# Patient Record
Sex: Male | Born: 1985 | Race: Black or African American | Hispanic: No | Marital: Single | Smoking: Current some day smoker
Health system: Southern US, Community
[De-identification: ages and names within clinical notes are randomized; demographics above are authoritative.]

---

## 2017-08-01 ENCOUNTER — Other Ambulatory Visit: Payer: Self-pay

## 2017-08-01 ENCOUNTER — Emergency Department (HOSPITAL_COMMUNITY)
Admission: EM | Admit: 2017-08-01 | Discharge: 2017-08-01 | Disposition: A | Discharge status: Home / Self Care | Payer: Self-pay | Attending: Emergency Medicine | Admitting: Emergency Medicine

## 2017-08-01 ENCOUNTER — Encounter (HOSPITAL_COMMUNITY): Payer: Self-pay

## 2017-08-01 ENCOUNTER — Emergency Department (HOSPITAL_COMMUNITY): Payer: Self-pay

## 2017-08-01 DIAGNOSIS — M25551 Pain in right hip: Secondary | ICD-10-CM | POA: Insufficient documentation

## 2017-08-01 DIAGNOSIS — F17290 Nicotine dependence, other tobacco product, uncomplicated: Secondary | ICD-10-CM | POA: Insufficient documentation

## 2017-08-01 MED ORDER — IBUPROFEN 600 MG PO TABS: 600.0000 mg | ORAL_TABLET | Freq: Four times a day (QID) | ORAL | 0 refills | Status: DC | PRN

## 2017-08-01 MED ORDER — METHOCARBAMOL 500 MG PO TABS: 500.0000 mg | ORAL_TABLET | Freq: Two times a day (BID) | ORAL | 0 refills | Status: DC

## 2017-08-01 MED ORDER — IBUPROFEN 400 MG PO TABS
600.0000 mg | ORAL_TABLET | Freq: Once | ORAL | Status: AC
  Administered 2017-08-01: 600 mg via ORAL
  Filled 2017-08-01: qty 1

## 2017-08-01 NOTE — ED Triage Notes (Signed)
Pt endorses  Right hip pain that began yesterday. Denies injury, cms intact. VSS. Ambulatory.

## 2017-08-01 NOTE — ED Provider Notes (Signed)
[facility] EMERGENCY DEPARTMENT Provider Note   CSN: [id number] Arrival date & time: 08/01/17  1440     History   Chief Complaint Chief Complaint  Patient presents with  . Hip Pain    HPI Bobby David is a 32 y.o. male with no significant past medical history presents emergency department today for right hip pain.  Patient states that he works a strenuous job and noticed that when he was bending down to lift something up he had pain in his right hip that did not radiate.  He reports the pain is a dull, achy pain.  Is worse with palpation as well as when he moves his right hip in certain directions.  He has tried icy hot for his symptoms without relief.  Patient denies any fever, IV drug use, abdominal pain, nausea/vomiting/diarrhea, groin pain, testicular pain, urinary symptoms, numbness/tingling/weakness, back pain, bowel/bladder incontinence, urinary retention, joint swelling or changes of the skin.  HPI  History reviewed. No pertinent past medical history.  There are no active problems to display for this patient.   History reviewed. No pertinent surgical history.      Home Medications    Prior to Admission medications   Not on File    Family History History reviewed. No pertinent family history.  Social History Social History   Tobacco Use  . Smoking status: Current Some Day Smoker    Types: Cigars  Substance Use Topics  . Alcohol use: Yes    Comment: occ  . Drug use: Never     Allergies   Patient has no known allergies.   Review of Systems Review of Systems  All other systems reviewed and are negative.    Physical Exam Updated Vital Signs BP 138/79 (BP Location: Right Arm)   Pulse 98   Temp 99 F (37.2 C) (Oral)   Resp 16   Ht 6' (1.829 m)   Wt 108.9 kg (240 lb)   SpO2 98%   BMI 32.55 kg/m   Physical Exam  Constitutional: He appears well-developed and well-nourished.  HENT:  Head: Normocephalic and atraumatic.   Right Ear: External ear normal.  Left Ear: External ear normal.  Eyes: Conjunctivae are normal. Right eye exhibits no discharge. Left eye exhibits no discharge. No scleral icterus.  Cardiovascular:  Pulses:      Dorsalis pedis pulses are 2+ on the right side, and 2+ on the left side.       Posterior tibial pulses are 2+ on the right side, and 2+ on the left side.  No lower extremity edema.   Pulmonary/Chest: Effort normal. No respiratory distress.  Abdominal: Soft. Normal appearance and bowel sounds are normal. He exhibits no distension. There is no tenderness. There is no rigidity, no rebound, no guarding and no CVA tenderness. No hernia.  Musculoskeletal:       Right knee: Normal.       Legs: No lumbar spinous tenderness palpation or step-offs. Patient with tenderness palpation over right anterior and lateral hip.  Patient with able range of motion for hip flexion, extension, internal and external rotation.  He does note pain at extremes of these range of motion.  He has able gait but notes it is painful.  He is neurovascular intact distally.  Compartments are soft.  Neurological: He is alert.  Skin: Skin is warm, dry and intact. Capillary refill takes less than 2 seconds. No erythema. No pallor.  No joint swelling or overlying erythema or heat  Psychiatric:  He has a normal mood and affect.  Nursing note and vitals reviewed.    ED Treatments / Results  Labs (all labs ordered are listed, but only abnormal results are displayed) Labs Reviewed - No data to display  EKG None  Radiology Dg Hip Unilat W Or Wo Pelvis 2-3 Views Right  Result Date: 08/01/2017 CLINICAL DATA:  Acute right hip pain without known injury. EXAM: DG HIP (WITH OR WITHOUT PELVIS) 2-3V RIGHT COMPARISON:  None. FINDINGS: There is no evidence of hip fracture or dislocation. There is no evidence of arthropathy or other focal bone abnormality. IMPRESSION: Normal right hip. Electronically Signed   By: Lupita RaiderJames  Green Jr,  M.D.   On: 08/01/2017 15:37    Procedures Procedures (including critical care time)  Medications Ordered in ED Medications - No data to display   Initial Impression / Assessment and Plan / ED Course  I have reviewed the triage vital signs and the nursing notes.  Pertinent labs & imaging results that were available during my care of the patient were reviewed by me and considered in my medical decision making (see chart for details).     32 y.o. male presenting for right hip pain that is worse with range of motion and palpation.  He denies any trauma.  X-rays without evidence of fracture dislocation.  He denies any abdominal pain.  Abdominal exam without any tenderness palpation.  No peritoneal signs.  No hernia palpated.  Patient is without any fever. Vitals reassuring.  He denies any IV drug use.  He is without joint swelling, overlying erythema and he has intact range of motion.  Do not suspect septic joint.  Exam is reassuring as above. He denies testicular pain or urinary symptoms. No low back pain. No concern for cauda equina.  He is neurovascular intact and compartments are soft.  Will treat as possible muscle strain.  Will give crutches in the department.  Will refer to orthopedics.  Advised conservative therapy. I advised the patient to follow-up with PCP/Orthopedics this week. Specific return precautions discussed. Time was given for all questions to be answered. The patient verbalized understanding and agreement with plan. The patient appears safe for discharge home.  Final Clinical Impressions(s) / ED Diagnoses   Final diagnoses:  Right hip pain    ED Discharge Orders        Ordered    ibuprofen (ADVIL,MOTRIN) 600 MG tablet  Every 6 hours PRN     08/01/17 1644    methocarbamol (ROBAXIN) 500 MG tablet  2 times daily     08/01/17 1644       Princella PellegriniMaczis, Tawona Filsinger M, PA-C 08/01/17 1651    Rolland PorterJames, Mark, MD 08/08/17 [phone]

## 2017-08-01 NOTE — Progress Notes (Signed)
Orthopedic Tech Progress Note Patient Details:  Bobby David February 26, 1985 [id number]  Ortho Devices Type of Ortho Device: Crutches Ortho Device/Splint Interventions: Application   Post Interventions Patient Tolerated: Well, Ambulated well Instructions Provided: Care of device, Poper ambulation with device   Bobby David 08/01/2017, 4:52 PM

## 2017-08-01 NOTE — Discharge Instructions (Addendum)
Please read and follow all provided instructions.  You have been seen today for right hip pain  Tests performed today include: An x-ray of the affected area - does NOT show any broken bones or dislocations.  Vital signs. See below for your results today.   Home care instructions: -- *PRICE in the first 24-48 hours after injury: Protect (with brace, splint, sling), if given by your provider Rest Ice- Do not apply ice pack directly to your skin, place towel or similar between your skin and ice/ice pack. Apply ice for 20 min, then remove for 40 min while awake Compression- Wear brace, elastic bandage, splint as directed by your provider Elevate affected extremity above the level of your heart when not walking around for the first 24-48 hours   Use Ibuprofen (Motrin/Advil) 600mg  every 6 hours as needed for pain (do not exceed max dose in 24 hours, 2400mg )  Follow-up instructions: Please follow-up with your primary care provider or the provided orthopedic physician (bone specialist) if you continue to have significant pain in 1 week. In this case you may have a more severe injury that requires further care.   Return instructions:  Please return if your toes or feet are numb or tingling, appear gray or blue, or you have severe pain (also elevate the leg and loosen splint or wrap if you were given one) Please return to the Emergency Department if you experience worsening symptoms.  Please return if you have any other emergent concerns. Additional Information:  Your vital signs today were: BP 138/79 (BP Location: Right Arm)    Pulse 98    Temp 99 F (37.2 C) (Oral)    Resp 16    Ht 6' (1.829 m)    Wt 108.9 kg (240 lb)    SpO2 98%    BMI 32.55 kg/m  If your blood pressure (BP) was elevated above 135/85 this visit, please have this repeated by your doctor within one month. ---------------

## 2017-08-01 NOTE — ED Notes (Signed)
Patient verbalized understanding of discharge instructions and denies any further needs or questions at this time. VS stable. Patient ambulatory with steady gait.  

## 2019-02-14 ENCOUNTER — Emergency Department (HOSPITAL_COMMUNITY)
Admission: EM | Admit: 2019-02-14 | Discharge: 2019-02-14 | Disposition: A | Discharge status: Home / Self Care | Payer: Managed Care, Other (non HMO) | Attending: Emergency Medicine | Admitting: Emergency Medicine

## 2019-02-14 ENCOUNTER — Encounter (HOSPITAL_COMMUNITY): Payer: Self-pay | Admitting: Emergency Medicine

## 2019-02-14 ENCOUNTER — Other Ambulatory Visit: Payer: Self-pay

## 2019-02-14 ENCOUNTER — Emergency Department (HOSPITAL_COMMUNITY): Payer: Managed Care, Other (non HMO)

## 2019-02-14 DIAGNOSIS — R945 Abnormal results of liver function studies: Secondary | ICD-10-CM | POA: Insufficient documentation

## 2019-02-14 DIAGNOSIS — R059 Cough, unspecified: Secondary | ICD-10-CM

## 2019-02-14 DIAGNOSIS — F17290 Nicotine dependence, other tobacco product, uncomplicated: Secondary | ICD-10-CM | POA: Diagnosis not present

## 2019-02-14 DIAGNOSIS — R05 Cough: Secondary | ICD-10-CM

## 2019-02-14 DIAGNOSIS — U071 COVID-19: Secondary | ICD-10-CM | POA: Diagnosis not present

## 2019-02-14 DIAGNOSIS — R7989 Other specified abnormal findings of blood chemistry: Secondary | ICD-10-CM | POA: Diagnosis not present

## 2019-02-14 LAB — CBC WITH DIFFERENTIAL/PLATELET
Abs Immature Granulocytes: 0.01 10*3/uL (ref 0.00–0.07)
Basophils Absolute: 0 10*3/uL (ref 0.0–0.1)
Basophils Relative: 1 %
Eosinophils Absolute: 0 10*3/uL (ref 0.0–0.5)
Eosinophils Relative: 1 %
HCT: 42.6 % (ref 39.0–52.0)
Hemoglobin: 14.5 g/dL (ref 13.0–17.0)
Immature Granulocytes: 0 %
Lymphocytes Relative: 43 %
Lymphs Abs: 1.6 10*3/uL (ref 0.7–4.0)
MCH: 31.4 pg (ref 26.0–34.0)
MCHC: 34 g/dL (ref 30.0–36.0)
MCV: 92.2 fL (ref 80.0–100.0)
Monocytes Absolute: 0.9 10*3/uL (ref 0.1–1.0)
Monocytes Relative: 24 %
Neutro Abs: 1.1 10*3/uL — ABNORMAL LOW (ref 1.7–7.7)
Neutrophils Relative %: 31 %
Platelets: 160 10*3/uL (ref 150–400)
RBC: 4.62 MIL/uL (ref 4.22–5.81)
RDW: 12.2 % (ref 11.5–15.5)
WBC: 3.6 10*3/uL — ABNORMAL LOW (ref 4.0–10.5)
nRBC: 0 % (ref 0.0–0.2)

## 2019-02-14 LAB — COMPREHENSIVE METABOLIC PANEL
ALT: 72 U/L — ABNORMAL HIGH (ref 0–44)
AST: 47 U/L — ABNORMAL HIGH (ref 15–41)
Albumin: 4.1 g/dL (ref 3.5–5.0)
Alkaline Phosphatase: 58 U/L (ref 38–126)
Anion gap: 10 (ref 5–15)
BUN: 12 mg/dL (ref 6–20)
CO2: 27 mmol/L (ref 22–32)
Calcium: 9.1 mg/dL (ref 8.9–10.3)
Chloride: 102 mmol/L (ref 98–111)
Creatinine, Ser: 1.64 mg/dL — ABNORMAL HIGH (ref 0.61–1.24)
GFR calc Af Amer: >60 mL/min (ref >60)
GFR calc non Af Amer: 54 mL/min — ABNORMAL LOW (ref >60)
Glucose, Bld: 100 mg/dL — ABNORMAL HIGH (ref 70–99)
Potassium: 4.5 mmol/L (ref 3.5–5.1)
Sodium: 139 mmol/L (ref 135–145)
Total Bilirubin: 0.7 mg/dL (ref 0.3–1.2)
Total Protein: 7.2 g/dL (ref 6.5–8.1)

## 2019-02-14 MED ORDER — FLUTICASONE PROPIONATE 50 MCG/ACT NA SUSP: 1.0000 | Freq: Every day | NASAL | 0 refills | Status: DC

## 2019-02-14 MED ORDER — METHOCARBAMOL 500 MG PO TABS: 500.0000 mg | ORAL_TABLET | Freq: Two times a day (BID) | ORAL | 0 refills | Status: DC | PRN

## 2019-02-14 MED ORDER — SODIUM CHLORIDE 0.9% FLUSH: 3.0000 mL | Freq: Once | INTRAVENOUS | Status: DC

## 2019-02-14 MED ORDER — BENZONATATE 100 MG PO CAPS: 100.0000 mg | ORAL_CAPSULE | Freq: Three times a day (TID) | ORAL | 0 refills | Status: DC | PRN

## 2019-02-14 NOTE — ED Notes (Signed)
No answer from pt in waiting room 

## 2019-02-14 NOTE — ED Triage Notes (Signed)
Cough with chest pain, started yesterday, has been taking flu and cough medicine. No known exposures to covid, no known fever.

## 2019-02-14 NOTE — ED Triage Notes (Signed)
C/o cough since yesterday.  Denies any other complaints.

## 2019-02-14 NOTE — Discharge Instructions (Signed)
You were seen in the emergency department today for a cough with chest pain.  Your chest x-ray was normal.  Your labs show that your kidney and liver function tests were elevated and that your white blood cell count was somewhat low.  We would like you to have each of these labs retested by primary care provider within 1 to 2 weeks.  Please not take ibuprofen or other anti-inflammatories due to your elevated kidney function.  You may take Tylenol but please take half the recommended dose if you are going to due to your elevated liver function test.  Do not drink alcohol.  At this time we are concerned that you have a viral process such as COVID-19, we have tested you for this, we will call you with results within 48 hours.  In the meantime we are sending you home with the following medicines:  -Flonase: Use 1 spray per nostril daily as needed for nasal congestion. -Tessalon: Use 1 tablet every 8 hours as needed for coughing. -Robaxin: is the muscle relaxer I have prescribed, this is meant to help with muscle tightness. Be aware that this medication may make you drowsy therefore the first time you take this it should be at a time you are in an environment where you can rest. Do not drive or operate heavy machinery when taking this medication. Do not drink alcohol or take other sedating medications with this medicine such as narcotics or benzodiazepines.   We have prescribed you new medication(s) today. Discuss the medications prescribed today with your pharmacist as they can have adverse effects and interactions with your other medicines including over the counter and prescribed medications. Seek medical evaluation if you start to experience new or abnormal symptoms after taking one of these medicines, seek care immediately if you start to experience difficulty breathing, feeling of your throat closing, facial swelling, or rash as these could be indications of a more serious allergic reaction  We have  tested you for COVID 19, we will call you within the next 72 hours if results are positive, you may also view these results on MyChart.   We are instructing patient's with COVID 19 or symptoms of COVID 19 to quarantine themselves for 14 days. You may be able to discontinue self quarantine if the following conditions are met:   Persons with COVID-19 who have symptoms and were directed to care for themselves at home may discontinue home isolation under the  following conditions: - It has been at least 7 days have passed since symptoms first appeared. - AND at least 3 days (72 hours) have passed since recovery defined as resolution of fever without the use of fever-reducing medications and improvement in respiratory symptoms (e.g., cough, shortness of breath)  Please follow the below quarantine instructions.   Please follow up with primary care within 3-5 days for re-evaluation- call prior to going to the office to make them aware of your symptoms as some offices are altering their method of seeing patients with COVID 19 symptoms. Return to the ER for new or worsening symptoms including but not limited to increased work of breathing, worsened or different chest pain (I.e chest pain not just with coughing), passing out, or any other concerns.       Person Under Monitoring Name: [facility]  Location: [place] [address]   Infection Prevention Recommendations for Individuals Confirmed to have, or Being Evaluated for, 2019 Novel Coronavirus (COVID-19) Infection Who Receive Care at [facility]  Individuals who are confirmed to have, or are being evaluated for, COVID-19 should follow the prevention steps below until a healthcare provider or local or state health department says they can return to normal activities.  Stay home except to get medical care You should restrict activities outside your home, except for getting medical care. Do not go to work, school, or public areas,  and do not use public transportation or taxis.  Call ahead before visiting your doctor Before your medical appointment, call the healthcare provider and tell them that you have, or are being evaluated for, COVID-19 infection. This will help the healthcare provider's office take steps to keep other people from getting infected. Ask your healthcare provider to call the local or state health department.  Monitor your symptoms Seek prompt medical attention if your illness is worsening (e.g., difficulty breathing). Before going to your medical appointment, call the healthcare provider and tell them that you have, or are being evaluated for, COVID-19 infection. Ask your healthcare provider to call the local or state health department.  Wear a facemask You should wear a facemask that covers your nose and mouth when you are in the same room with other people and when you visit a healthcare provider. People who live with or visit you should also wear a facemask while they are in the same room with you.  Separate yourself from other people in your home As much as possible, you should stay in a different room from other people in your home. Also, you should use a separate bathroom, if available.  Avoid sharing household items You should not share dishes, drinking glasses, cups, eating utensils, towels, bedding, or other items with other people in your home. After using these items, you should wash them thoroughly with soap and water.  Cover your coughs and sneezes Cover your mouth and nose with a tissue when you cough or sneeze, or you can cough or sneeze into your sleeve. Throw used tissues in a lined trash can, and immediately wash your hands with soap and water for at least 20 seconds or use an alcohol-based hand rub.  Wash your [facility] your hands often and thoroughly with soap and water for at least 20 seconds. You can use an alcohol-based hand sanitizer if soap and water are not  available and if your hands are not visibly dirty. Avoid touching your eyes, nose, and mouth with unwashed hands.   Prevention Steps for Caregivers and Household Members of Individuals Confirmed to have, or Being Evaluated for, COVID-19 Infection Being Cared for in the Home  If you live with, or provide care at home for, a person confirmed to have, or being evaluated for, COVID-19 infection please follow these guidelines to prevent infection:  Follow healthcare provider's instructions Make sure that you understand and can help the patient follow any healthcare provider instructions for all care.  Provide for the patient's basic needs You should help the patient with basic needs in the home and provide support for getting groceries, prescriptions, and other personal needs.  Monitor the patient's symptoms If they are getting sicker, call his or her medical provider and tell them that the patient has, or is being evaluated for, COVID-19 infection. This will help the healthcare provider's office take steps to keep other people from getting infected. Ask the healthcare provider to call the local or state health department.  Limit the number of people who have contact with the patient If possible, have only one caregiver for the  patient. Other household members should stay in another home or place of residence. If this is not possible, they should stay in another room, or be separated from the patient as much as possible. Use a separate bathroom, if available. Restrict visitors who do not have an essential need to be in the home.  Keep older adults, very young children, and other sick people away from the patient Keep older adults, very young children, and those who have compromised immune systems or chronic health conditions away from the patient. This includes people with chronic heart, lung, or kidney conditions, diabetes, and cancer.  Ensure good ventilation Make sure that shared spaces  in the home have good air flow, such as from an air conditioner or an opened window, weather permitting.  Wash your hands often Wash your hands often and thoroughly with soap and water for at least 20 seconds. You can use an alcohol based hand sanitizer if soap and water are not available and if your hands are not visibly dirty. Avoid touching your eyes, nose, and mouth with unwashed hands. Use disposable paper towels to dry your hands. If not available, use dedicated cloth towels and replace them when they become wet.  Wear a facemask and gloves Wear a disposable facemask at all times in the room and gloves when you touch or have contact with the patient's blood, body fluids, and/or secretions or excretions, such as sweat, saliva, sputum, nasal mucus, vomit, urine, or feces.  Ensure the mask fits over your nose and mouth tightly, and do not touch it during use. Throw out disposable facemasks and gloves after using them. Do not reuse. Wash your hands immediately after removing your facemask and gloves. If your personal clothing becomes contaminated, carefully remove clothing and launder. Wash your hands after handling contaminated clothing. Place all used disposable facemasks, gloves, and other waste in a lined container before disposing them with other household waste. Remove gloves and wash your hands immediately after handling these items.  Do not share dishes, glasses, or other household items with the patient Avoid sharing household items. You should not share dishes, drinking glasses, cups, eating utensils, towels, bedding, or other items with a patient who is confirmed to have, or being evaluated for, COVID-19 infection. After the person uses these items, you should wash them thoroughly with soap and water.  Wash laundry thoroughly Immediately remove and wash clothes or bedding that have blood, body fluids, and/or secretions or excretions, such as sweat, saliva, sputum, nasal mucus,  vomit, urine, or feces, on them. Wear gloves when handling laundry from the patient. Read and follow directions on labels of laundry or clothing items and detergent. In general, wash and dry with the warmest temperatures recommended on the label.  Clean all areas the individual has used often Clean all touchable surfaces, such as counters, tabletops, doorknobs, bathroom fixtures, toilets, phones, keyboards, tablets, and bedside tables, every day. Also, clean any surfaces that may have blood, body fluids, and/or secretions or excretions on them. Wear gloves when cleaning surfaces the patient has come in contact with. Use a diluted bleach solution (e.g., dilute bleach with 1 part bleach and 10 parts water) or a household disinfectant with a label that says EPA-registered for coronaviruses. To make a bleach solution at home, add 1 tablespoon of bleach to 1 quart (4 cups) of water. For a larger supply, add  cup of bleach to 1 gallon (16 cups) of water. Read labels of cleaning products and follow recommendations provided on  product labels. Labels contain instructions for safe and effective use of the cleaning product including precautions you should take when applying the product, such as wearing gloves or eye protection and making sure you have good ventilation during use of the product. Remove gloves and wash hands immediately after cleaning.  Monitor yourself for signs and symptoms of illness Caregivers and household members are considered close contacts, should monitor their health, and will be asked to limit movement outside of the home to the extent possible. Follow the monitoring steps for close contacts listed on the symptom monitoring form.   ? If you have additional questions, contact your local health department or call the epidemiologist on call at [phone] (available 24/7). ? This guidance is subject to change. For the most up-to-date guidance from [facility], please refer to their  website: [email or web address].pl

## 2019-02-14 NOTE — ED Provider Notes (Signed)
[facility] EMERGENCY DEPARTMENT Provider Note   CSN: [id number] Arrival date & time: 02/14/19  1208     History Chief Complaint  Patient presents with  . Cough    [facility] is a 34 y.o. male with a hx of tobacco abuse who presents to the ED with complaints of cough for the past few days.  Patient states symptoms started with nasal congestion and cough productive of yellow phlegm sputum, subsequently developed chest pain specific to coughing today, otherwise no chest pain.  Other than coughing no alleviating aggravating factors.  No intervention prior to arrival.  Denies fever, chills, ear pain, sore throat, shortness of breath, abdominal pain, vomiting, diarrhea, leg pain/swelling, hemoptysis, recent surgery/trauma, recent long travel, hormone use, personal hx of cancer, or hx of DVT/PE.      HPI     History reviewed. No pertinent past medical history.  There are no problems to display for this patient.   History reviewed. No pertinent surgical history.     No family history on file.  Social History   Tobacco Use  . Smoking status: Current Some Day Smoker    Types: Cigars  Substance Use Topics  . Alcohol use: Yes    Comment: occ  . Drug use: Never    Home Medications Prior to Admission medications   Medication Sig Start Date End Date Taking? Authorizing Provider  ibuprofen (ADVIL,MOTRIN) 600 MG tablet Take 1 tablet (600 mg total) by mouth every 6 (six) hours as needed. 08/01/17   Maczis, Elmer Sow, PA-C  methocarbamol (ROBAXIN) 500 MG tablet Take 1 tablet (500 mg total) by mouth 2 (two) times daily. 08/01/17   Maczis, Elmer Sow, PA-C    Allergies    Patient has no known allergies.  Review of Systems   Review of Systems  Constitutional: Negative for chills and fever.  HENT: Positive for congestion. Negative for ear pain and sore throat.   Respiratory: Positive for cough. Negative for shortness of breath.   Cardiovascular: Positive for  chest pain (with coughing, otherwise none). Negative for leg swelling.  Gastrointestinal: Negative for abdominal pain, diarrhea, nausea and vomiting.  Musculoskeletal: Negative for myalgias.  Neurological: Negative for syncope.  All other systems reviewed and are negative.   Physical Exam Updated Vital Signs BP 138/90 (BP Location: Left Arm)   Pulse 87   Temp 98.2 F (36.8 C) (Oral)   Resp 16   Ht 6' (1.829 m)   Wt 121.6 kg   SpO2 99%   BMI 36.35 kg/m   Physical Exam Vitals and nursing note reviewed.  Constitutional:      General: He is not in acute distress.    Appearance: He is well-developed. He is not toxic-appearing.  HENT:     Head: Normocephalic and atraumatic.     Right Ear: Ear canal normal. Tympanic membrane is not perforated, erythematous, retracted or bulging.     Left Ear: Ear canal normal. Tympanic membrane is not perforated, erythematous, retracted or bulging.     Ears:     Comments: No mastoid erythema/swellng/tenderness.     Nose: Congestion present.     Right Sinus: No maxillary sinus tenderness or frontal sinus tenderness.     Left Sinus: No maxillary sinus tenderness or frontal sinus tenderness.     Mouth/Throat:     Pharynx: Oropharynx is clear. Uvula midline. No oropharyngeal exudate or posterior oropharyngeal erythema.     Comments: Posterior oropharynx is symmetric appearing. Patient tolerating own secretions  without difficulty. No trismus. No drooling. No hot potato voice. No swelling beneath the tongue, submandibular compartment is soft.  Eyes:     General:        Right eye: No discharge.        Left eye: No discharge.     Conjunctiva/sclera: Conjunctivae normal.  Cardiovascular:     Rate and Rhythm: Normal rate and regular rhythm.  Pulmonary:     Effort: Pulmonary effort is normal. No respiratory distress.     Breath sounds: Normal breath sounds. No wheezing, rhonchi or rales.  Chest:     Chest wall: Tenderness (Anterior chest wall,  reproduces patient's chest pain.) present.  Abdominal:     General: There is no distension.     Palpations: Abdomen is soft.     Tenderness: There is no abdominal tenderness.  Musculoskeletal:     Cervical back: Neck supple. No rigidity.  Lymphadenopathy:     Cervical: No cervical adenopathy.  Skin:    General: Skin is warm and dry.     Findings: No rash.  Neurological:     Mental Status: He is alert.  Psychiatric:        Behavior: Behavior normal.     ED Results / Procedures / Treatments   Labs (all labs ordered are listed, but only abnormal results are displayed) Labs Reviewed  COMPREHENSIVE METABOLIC PANEL - Abnormal; Notable for the following components:      Result Value   Glucose, Bld 100 (*)    Creatinine, Ser 1.64 (*)    AST 47 (*)    ALT 72 (*)    GFR calc non Af Amer 54 (*)    All other components within normal limits  CBC WITH DIFFERENTIAL/PLATELET - Abnormal; Notable for the following components:   WBC 3.6 (*)    Neutro Abs 1.1 (*)    All other components within normal limits    EKG None  Radiology DG Chest 2 View  Result Date: 02/14/2019 CLINICAL DATA:  Cough and chest pain EXAM: CHEST - 2 VIEW COMPARISON:  None. FINDINGS: The heart size and mediastinal contours are within normal limits. Both lungs are clear. The visualized skeletal structures are unremarkable. IMPRESSION: No active cardiopulmonary disease. Electronically Signed   By: Alcide Clever M.D.   On: 02/14/2019 13:44    Procedures Procedures (including critical care time)  Medications Ordered in ED Medications  sodium chloride flush (NS) 0.9 % injection 3 mL (has no administration in time range)    ED Course  I have reviewed the triage vital signs and the nursing notes.  Pertinent labs & imaging results that were available during my care of the patient were reviewed by me and considered in my medical decision making (see chart for details).  [facility] was evaluated in Emergency  Department on 02/14/2019 for the symptoms described in the history of present illness. He/she was evaluated in the context of the global COVID-19 pandemic, which necessitated consideration that the patient might be at risk for infection with the SARS-CoV-2 virus that causes COVID-19. Institutional protocols and algorithms that pertain to the evaluation of patients at risk for COVID-19 are in a state of rapid change based on information released by regulatory bodies including the CDC and federal and state organizations. These policies and algorithms were followed during the patient's care in the ED.    MDM Rules/Calculators/A&P  Patient presents to the emergency department with complaints of congestion, cough, and chest pain with coughing.  Nontoxic-appearing, resting comfortably, vitals WNL.  Afebrile, no sinus tenderness, symptoms less than 10 days, doubt acute bacterial sinusitis.  No meningismus.  No signs of AOM, AOE, or mastoiditis.  Oropharynx is clear.  Lungs CTA, chest x-ray negative for infiltrate, doubt acute bacterial pneumonia.  Chest x-ray also negative for pneumothorax, fracture, or fluid overload.  Patient is low risk Wells, PERC negative, doubt PE.  His chest pain is reproducible with anterior chest wall palpation and only occurs with coughing, suspect MSK. Labs per triage w/ elevate creatinine & LFTS also has leukopenia- will need PCP recheck. Given elevated LFTs w/ leukopenia some concern for COVID 19- PCR test sent, discussed quarantine. Supportive care. I discussed results, treatment plan, need for follow-up, and return precautions with the patient. Provided opportunity for questions, patient confirmed understanding and is in agreement with plan.     Final Clinical Impression(s) / ED Diagnoses Final diagnoses:  Cough  Elevated LFTs  Elevated serum creatinine    Rx / DC Orders ED Discharge Orders         Ordered    fluticasone (FLONASE) 50 MCG/ACT nasal spray   Daily     02/14/19 1808    benzonatate (TESSALON) 100 MG capsule  3 times daily PRN     02/14/19 1808    methocarbamol (ROBAXIN) 500 MG tablet  2 times daily PRN     02/14/19 1808           Amaryllis Dyke, PA-C 02/14/19 1856    Charlesetta Shanks, MD 02/22/19 1126

## 2019-02-15 LAB — SARS CORONAVIRUS 2 (TAT 6-24 HRS): SARS Coronavirus 2: POSITIVE — AB

## 2019-02-16 ENCOUNTER — Telehealth: Payer: Self-pay | Admitting: *Deleted

## 2019-02-16 NOTE — Telephone Encounter (Signed)
Reviewed positive Covid 19 results with the patient.He has congestion today and was given Flonase at the ED. Discussed while staying home and away from others over the next 10 days be sure to disinfect common household areas and wash your hands frequently. Encouraged fluids and rest. Any changes/concerns with your breathing  Call your doctor or seek treatment at the ED with a mask on. Reviewed end of isolation time including no fever/fever-reducing medicine the last 2-3 days and any respiratory symptoms must be improving at the time. [facility] notified.

## 2019-02-22 ENCOUNTER — Emergency Department (HOSPITAL_COMMUNITY)
Admission: EM | Admit: 2019-02-22 | Discharge: 2019-02-23 | Disposition: A | Discharge status: Home / Self Care | Payer: Managed Care, Other (non HMO) | Attending: Emergency Medicine | Admitting: Emergency Medicine

## 2019-02-22 DIAGNOSIS — Z79899 Other long term (current) drug therapy: Secondary | ICD-10-CM | POA: Insufficient documentation

## 2019-02-22 DIAGNOSIS — R0781 Pleurodynia: Secondary | ICD-10-CM | POA: Insufficient documentation

## 2019-02-22 DIAGNOSIS — F17290 Nicotine dependence, other tobacco product, uncomplicated: Secondary | ICD-10-CM | POA: Diagnosis not present

## 2019-02-22 DIAGNOSIS — U071 COVID-19: Secondary | ICD-10-CM | POA: Insufficient documentation

## 2019-02-22 DIAGNOSIS — R05 Cough: Secondary | ICD-10-CM | POA: Diagnosis present

## 2019-02-22 NOTE — ED Notes (Signed)
Called for triage x3., no answer, unable to locate patient.

## 2019-02-23 ENCOUNTER — Other Ambulatory Visit: Payer: Self-pay

## 2019-02-23 ENCOUNTER — Encounter (HOSPITAL_COMMUNITY): Payer: Self-pay | Admitting: Emergency Medicine

## 2019-02-23 ENCOUNTER — Emergency Department (HOSPITAL_COMMUNITY): Payer: Managed Care, Other (non HMO)

## 2019-02-23 MED ORDER — ACETAMINOPHEN 500 MG PO TABS
1000.0000 mg | ORAL_TABLET | Freq: Once | ORAL | Status: AC
  Administered 2019-02-23: 1000 mg via ORAL
  Filled 2019-02-23: qty 2

## 2019-02-23 MED ORDER — GUAIFENESIN ER 1200 MG PO TB12: 1.0000 | ORAL_TABLET | Freq: Two times a day (BID) | ORAL | 0 refills | Status: DC

## 2019-02-23 MED ORDER — PROMETHAZINE-DM 6.25-15 MG/5ML PO SYRP: 5.0000 mL | ORAL_SOLUTION | Freq: Four times a day (QID) | ORAL | 0 refills | Status: DC | PRN

## 2019-02-23 MED ORDER — ACETAMINOPHEN 325 MG PO TABS: 325.0000 mg | ORAL_TABLET | Freq: Once | ORAL | Status: DC

## 2019-02-23 NOTE — Discharge Instructions (Addendum)
Return here for any worsening in your condition.  Increase your fluid intake.  1000 mg of Tylenol every 6 hours for fever.

## 2019-02-23 NOTE — ED Notes (Signed)
No needs from pt at this time. 

## 2019-02-23 NOTE — ED Provider Notes (Signed)
[facility] EMERGENCY DEPARTMENT Provider Note   CSN: [id number] Arrival date & time: 02/22/19  2314     History Chief Complaint  Patient presents with  . [id number] /Cough/ [place] is a 34 y.o. male.  HPI Patient presents to the emergency department with pain when he coughs.  The patient states that he was seen here last week after starting with symptoms about 11 days ago.  Patient states that he has had no other issues other than the pain when he coughs.  The patient states that he has had chills and fever.  The nursing note states that he has been short of breath but the patient states that mainly he has chest pain when he coughs.  Patient states has been taking over-the-counter medications without significant relief of his symptoms.  Patient states that he was taking the medications prescribed here as well.  The patient denies  shortness of breath, headache,blurred vision, neck pain,weakness, numbness, dizziness, anorexia, edema, abdominal pain, nausea, vomiting, diarrhea, rash, back pain, dysuria, hematemesis, bloody stool, near syncope, or syncope.    History reviewed. No pertinent past medical history.  There are no problems to display for this patient.   History reviewed. No pertinent surgical history.     No family history on file.  Social History   Tobacco Use  . Smoking status: Current Some Day Smoker    Types: Cigars  . Smokeless tobacco: Never Used  Substance Use Topics  . Alcohol use: Yes    Comment: occ  . Drug use: Never    Home Medications Prior to Admission medications   Medication Sig Start Date End Date Taking? Authorizing Provider  benzonatate (TESSALON) 100 MG capsule Take 1 capsule (100 mg total) by mouth 3 (three) times daily as needed for cough. 02/14/19   Petrucelli, Samantha R, PA-C  fluticasone (FLONASE) 50 MCG/ACT nasal spray Place 1 spray into both nostrils daily. 02/14/19   Petrucelli, Samantha R, PA-C    methocarbamol (ROBAXIN) 500 MG tablet Take 1 tablet (500 mg total) by mouth 2 (two) times daily as needed for muscle spasms. 02/14/19   Petrucelli, Glynda Jaeger, PA-C    Allergies    Patient has no known allergies.  Review of Systems   Review of Systems All other systems negative except as documented in the HPI. All pertinent positives and negatives as reviewed in the HPI. Physical Exam Updated Vital Signs BP 120/72   Pulse 99   Temp 99.2 F (37.3 C) (Oral)   Resp 16   Ht 6' (1.829 m)   Wt 121.6 kg   SpO2 97%   BMI 36.35 kg/m   Physical Exam Vitals and nursing note reviewed.  Constitutional:      General: He is not in acute distress.    Appearance: He is well-developed.  HENT:     Head: Normocephalic and atraumatic.  Eyes:     Pupils: Pupils are equal, round, and reactive to light.  Cardiovascular:     Rate and Rhythm: Normal rate and regular rhythm.     Heart sounds: Normal heart sounds. No murmur. No friction rub. No gallop.   Pulmonary:     Effort: Pulmonary effort is normal. No respiratory distress.     Breath sounds: Normal breath sounds. No wheezing.  Abdominal:     General: Bowel sounds are normal. There is no distension.     Palpations: Abdomen is soft.     Tenderness: There is no abdominal  tenderness.  Musculoskeletal:     Cervical back: Normal range of motion and neck supple.  Skin:    General: Skin is warm and dry.     Capillary Refill: Capillary refill takes less than 2 seconds.     Findings: No erythema or rash.  Neurological:     Mental Status: He is alert and oriented to person, place, and time.     Motor: No abnormal muscle tone.     Coordination: Coordination normal.  Psychiatric:        Behavior: Behavior normal.     ED Results / Procedures / Treatments   Labs (all labs ordered are listed, but only abnormal results are displayed) Labs Reviewed - No data to display  EKG None  Radiology DG Chest Portable 1 View  Result Date:  02/23/2019 CLINICAL DATA:  COVID positive chest congestion EXAM: PORTABLE CHEST 1 VIEW COMPARISON:  February 14, 2019 FINDINGS: The heart size and mediastinal contours are within normal limits. Mildly increased hazy airspace opacity seen at the periphery of the left lung base. The visualized skeletal structures are unremarkable. IMPRESSION: Hazy airspace opacity at the periphery of the left lung base which could be atelectasis and/or infectious etiology. Electronically Signed   By: Jonna Clark M.D.   On: 02/23/2019 01:59    Procedures Procedures (including critical care time)  Medications Ordered in ED Medications  acetaminophen (TYLENOL) tablet 1,000 mg (1,000 mg Oral Given 02/23/19 0620)    ED Course  I have reviewed the triage vital signs and the nursing notes.  Pertinent labs & imaging results that were available during my care of the patient were reviewed by me and considered in my medical decision making (see chart for details).    MDM Rules/Calculators/A&P                      Patient is well-appearing on examination and not showing any signs of respiratory distress.  Patient is sitting in the bed comfortably and is requiring no oxygen here in the emergency department.  The patient's vital signs have remained stable other than fever.  Patient is advised to monitor her symptoms closely.  The patient is advised to return for worsening in his condition.  The patient's only complaint today is mainly pain when coughing.  Patient does not have significant shortness of breath or chest pain other than when he coughs.  The patient is not showing any signs of significant distress.  [facility] was evaluated in Emergency Department on 02/23/2019 for the symptoms described in the history of present illness. He was evaluated in the context of the global COVID-19 pandemic, which necessitated consideration that the patient might be at risk for infection with the SARS-CoV-2 virus that causes  COVID-19. Institutional protocols and algorithms that pertain to the evaluation of patients at risk for COVID-19 are in a state of rapid change based on information released by regulatory bodies including the CDC and federal and state organizations. These policies and algorithms were followed during the patient's care in the ED.  Final Clinical Impression(s) / ED Diagnoses Final diagnoses:  None    Rx / DC Orders ED Discharge Orders    None       Charlestine Night, PA-C 02/23/19 0809    Virgina Norfolk, DO 02/23/19 [phone]

## 2019-02-23 NOTE — ED Triage Notes (Signed)
Patient reports productive cough with chest congestion and mild SOB onset today , tested positive for Covid19 this week . No fever or chills .

## 2019-09-25 ENCOUNTER — Emergency Department (HOSPITAL_COMMUNITY)
Admission: EM | Admit: 2019-09-25 | Discharge: 2019-09-25 | Disposition: A | Discharge status: Home / Self Care | Payer: Managed Care, Other (non HMO) | Attending: Emergency Medicine | Admitting: Emergency Medicine

## 2019-09-25 ENCOUNTER — Other Ambulatory Visit: Payer: Self-pay

## 2019-09-25 DIAGNOSIS — K0889 Other specified disorders of teeth and supporting structures: Secondary | ICD-10-CM | POA: Insufficient documentation

## 2019-09-25 DIAGNOSIS — F17290 Nicotine dependence, other tobacco product, uncomplicated: Secondary | ICD-10-CM | POA: Insufficient documentation

## 2019-09-25 DIAGNOSIS — Z79899 Other long term (current) drug therapy: Secondary | ICD-10-CM | POA: Insufficient documentation

## 2019-09-25 MED ORDER — PENICILLIN V POTASSIUM 500 MG PO TABS: 500.0000 mg | ORAL_TABLET | Freq: Four times a day (QID) | ORAL | 0 refills | Status: DC

## 2019-09-25 MED ORDER — PENICILLIN V POTASSIUM 500 MG PO TABS: 500.0000 mg | ORAL_TABLET | Freq: Four times a day (QID) | ORAL | 0 refills | Status: AC

## 2019-09-25 NOTE — ED Notes (Signed)
Patient verbalizes understanding of discharge instructions. Opportunity for questioning and answers were provided. Armband removed by staff, pt discharged from ED ambulatory by self\  

## 2019-09-25 NOTE — Discharge Instructions (Addendum)
Please take Tylenol and motrin as needed for pain and please follow up with dentistry.

## 2019-09-25 NOTE — ED Triage Notes (Signed)
Patient complains of right upper chipped tooth that is now causing pain. NAD

## 2019-09-25 NOTE — ED Provider Notes (Signed)
[facility] EMERGENCY DEPARTMENT Provider Note   CSN: [id number] Arrival date & time: 09/25/19  1135     History No chief complaint on file.   [facility] is a 34 y.o. male.  The history is provided by the patient.  Dental Pain Location:  Upper Upper teeth location:  1/RU 3rd molar Quality:  Aching Severity:  Moderate Onset quality:  Sudden Duration:  3 days Timing:  Constant Progression:  Worsening Chronicity:  New Context: dental fracture   Relieved by:  Nothing Worsened by:  Nothing Associated symptoms: no difficulty swallowing, no drooling, no facial pain, no facial swelling, no fever and no gum swelling        No past medical history on file.  There are no problems to display for this patient.   No past surgical history on file.     No family history on file.  Social History   Tobacco Use  . Smoking status: Current Some Day Smoker    Types: Cigars  . Smokeless tobacco: Never Used  Substance Use Topics  . Alcohol use: Yes    Comment: occ  . Drug use: Never    Home Medications Prior to Admission medications   Medication Sig Start Date End Date Taking? Authorizing Provider  benzonatate (TESSALON) 100 MG capsule Take 1 capsule (100 mg total) by mouth 3 (three) times daily as needed for cough. 02/14/19   Petrucelli, Samantha R, PA-C  fluticasone (FLONASE) 50 MCG/ACT nasal spray Place 1 spray into both nostrils daily. 02/14/19   Petrucelli, Samantha R, PA-C  Guaifenesin 1200 MG TB12 Take 1 tablet (1,200 mg total) by mouth 2 (two) times daily. 02/23/19   Lawyer, Cristal Deer, PA-C  methocarbamol (ROBAXIN) 500 MG tablet Take 1 tablet (500 mg total) by mouth 2 (two) times daily as needed for muscle spasms. 02/14/19   Petrucelli, Samantha R, PA-C  promethazine-dextromethorphan (PROMETHAZINE-DM) 6.25-15 MG/5ML syrup Take 5 mLs by mouth 4 (four) times daily as needed for cough. 02/23/19   Lawyer, Cristal Deer, PA-C    Allergies    Patient has  no known allergies.  Review of Systems   Review of Systems  Constitutional: Negative for chills and fever.  HENT: Positive for dental problem. Negative for drooling, ear pain, facial swelling and sore throat.   Eyes: Negative for pain and visual disturbance.  Respiratory: Negative for cough and shortness of breath.   Cardiovascular: Negative for chest pain and palpitations.  Gastrointestinal: Negative for abdominal pain and vomiting.  Genitourinary: Negative for dysuria and hematuria.  Musculoskeletal: Negative for arthralgias and back pain.  Skin: Negative for color change and rash.  Neurological: Negative for seizures and syncope.  All other systems reviewed and are negative.   Physical Exam Updated Vital Signs BP 125/83   Pulse 60   Temp 97.8 F (36.6 C) (Oral)   Resp 16   SpO2 100%   Physical Exam Vitals and nursing note reviewed.  Constitutional:      Appearance: He is well-developed.  HENT:     Head: Normocephalic and atraumatic.     Mouth/Throat:     Comments: Tenderness to palpation to R 3rd molar.  No signs of erythema, swelling or purulence.  Uvula midline w/o posterior oropharyngeal swelling. Eyes:     Conjunctiva/sclera: Conjunctivae normal.  Cardiovascular:     Rate and Rhythm: Normal rate and regular rhythm.     Heart sounds: No murmur heard.   Pulmonary:     Effort: Pulmonary effort is normal. No  respiratory distress.     Breath sounds: Normal breath sounds.  Abdominal:     Palpations: Abdomen is soft.     Tenderness: There is no abdominal tenderness.  Musculoskeletal:     Cervical back: Neck supple.  Skin:    General: Skin is warm and dry.  Neurological:     General: No focal deficit present.     Mental Status: He is alert and oriented to person, place, and time. Mental status is at baseline.     ED Results / Procedures / Treatments   Labs (all labs ordered are listed, but only abnormal results are displayed) Labs Reviewed - No data to  display  EKG None  Radiology No results found.  Procedures Procedures (including critical care time)  Medications Ordered in ED Medications - No data to display  ED Course  I have reviewed the triage vital signs and the nursing notes.  Pertinent labs & imaging results that were available during my care of the patient were reviewed by me and considered in my medical decision making (see chart for details).    MDM Rules/Calculators/A&P                          34 year old male with no relevant past medical history presents with dental pain.  Patient states that he chipped his right third molar approximately 3 days ago and has had continued pain since then.  States that he was planning on waiting till Monday to contact the dentist since he just got insurance however states that he cannot wait as the pain is too severe.  Denies fever, chills, face swelling, trouble swallowing, drooling, voice change.  Afebrile vital signs stable.  Exam as above.  Exam most notable for right third molar tenderness to palpation.  No signs of swelling, oral pharyngeal swelling, uvular deviation, facial swelling, periapical abscess.  Do not feel the patient requires imaging at this time given lack of evidence on physical exam for PTA, Ludwig's angina, facial cellulitis.  Patient's pain likely secondary to acute fracture of his right third molar.  This time feel patient is stable for discharge with instructions to follow-up with dentistry.  Will be sent home with instructions to take Tylenol Motrin for pain and to take penicillin until he can see dentistry.  He will also be discharged with a list of resources in the area for dental care.  Discussed this plan with the patient and he voiced agreement and understanding of the overall plan.  Patient was discharged in stable condition without further events.  Final Clinical Impression(s) / ED Diagnoses Final diagnoses:  None    Rx / DC Orders ED Discharge Orders      None       Rickey Primus, MD 09/25/19 1729    Clarene Duke, Ambrose Finland, MD 09/26/19 1029

## 2019-09-26 ENCOUNTER — Telehealth: Payer: Self-pay

## 2019-09-26 NOTE — Telephone Encounter (Signed)
Received a call from patient stating the medication was not at the pharmacy. Post ED visit. Called listed pharmacy and spoke to the pharm tech. The patient was there waiting, and the script was not there. Called the script as written  To pharm tech. Verbal order read back.

## 2021-05-03 IMAGING — DX DG CHEST 2V
2 series · 2 of 2 positions shown · non-contrast
Comparison: None.

CLINICAL DATA: Cough and chest pain

EXAM:
CHEST - 2 VIEW

[chest pa]
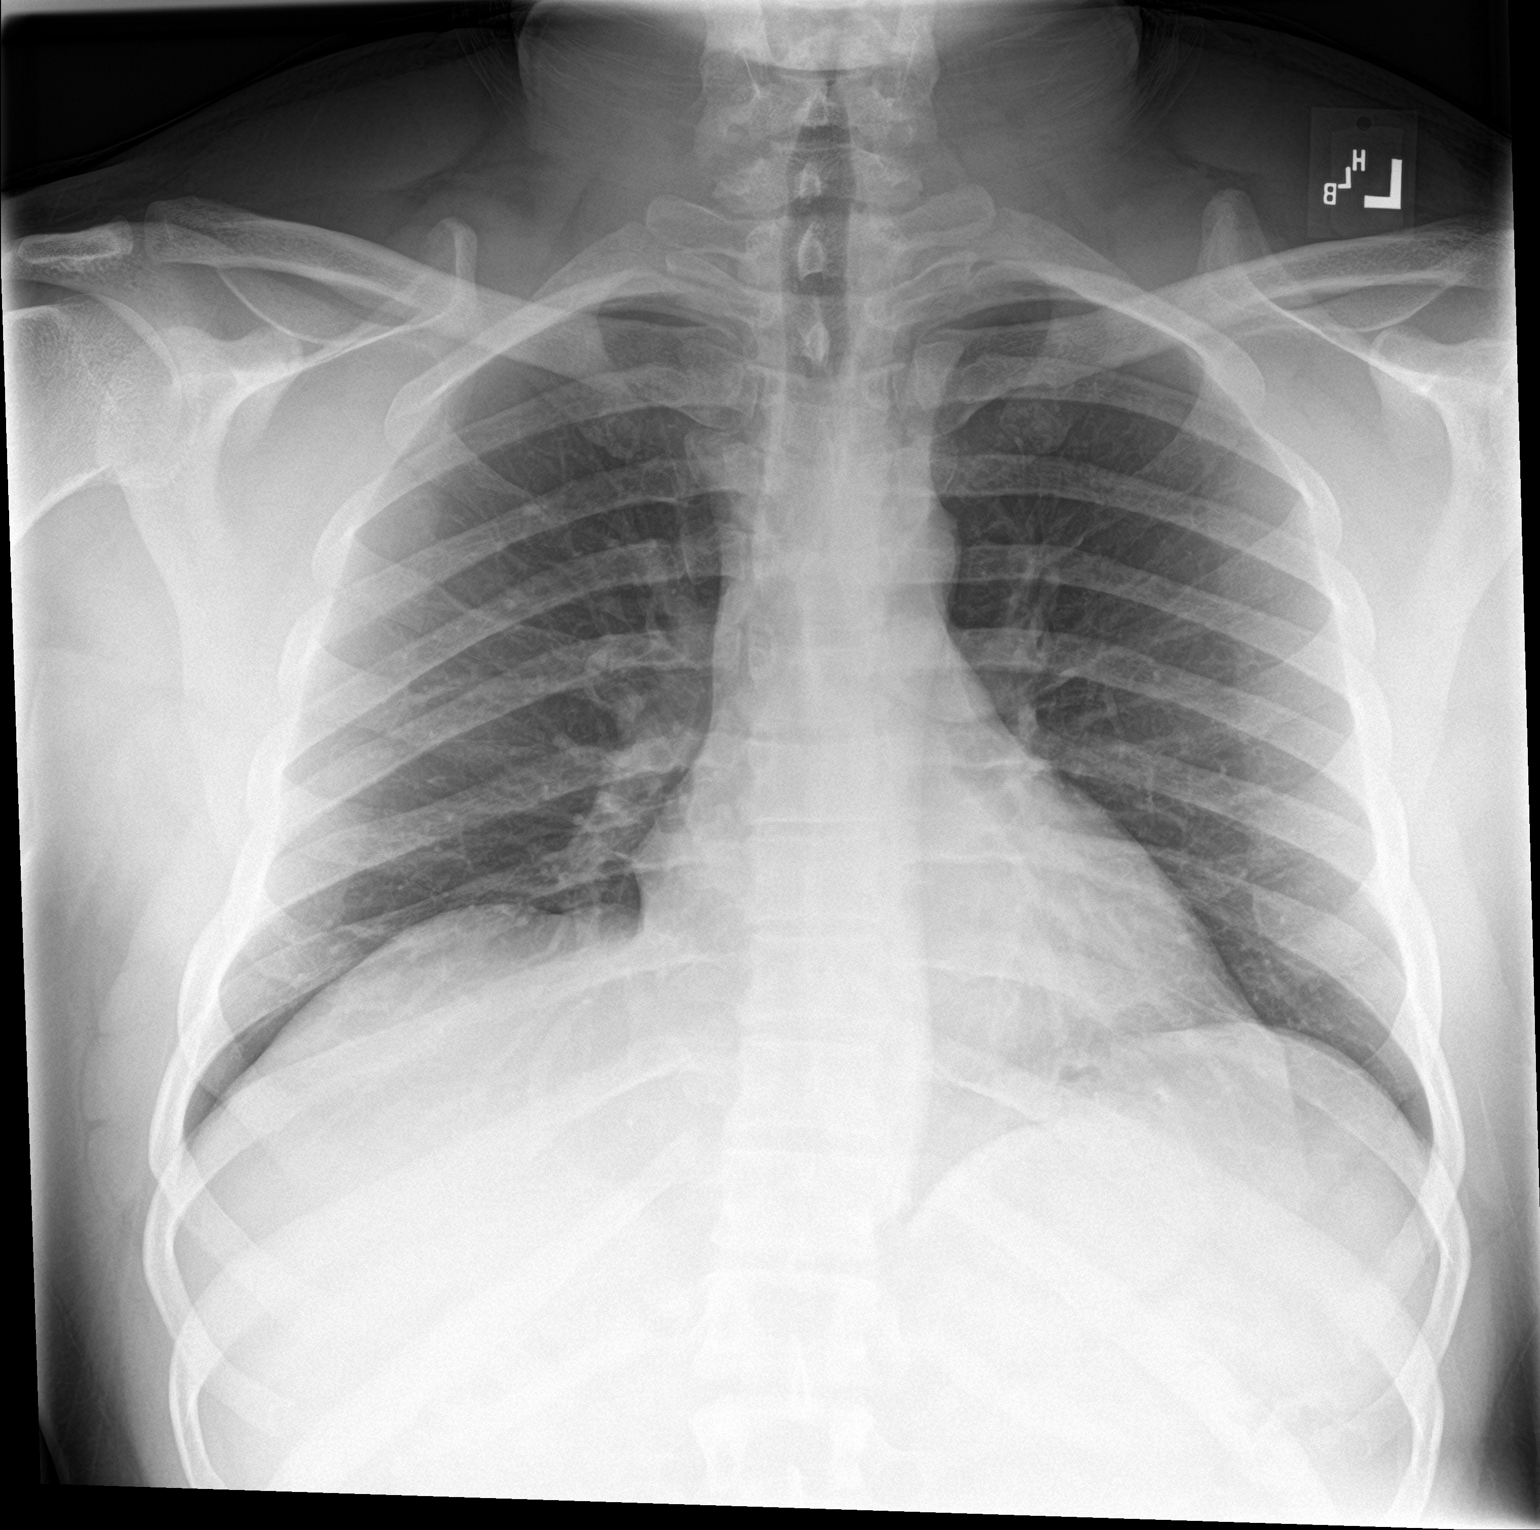

[chest lat]
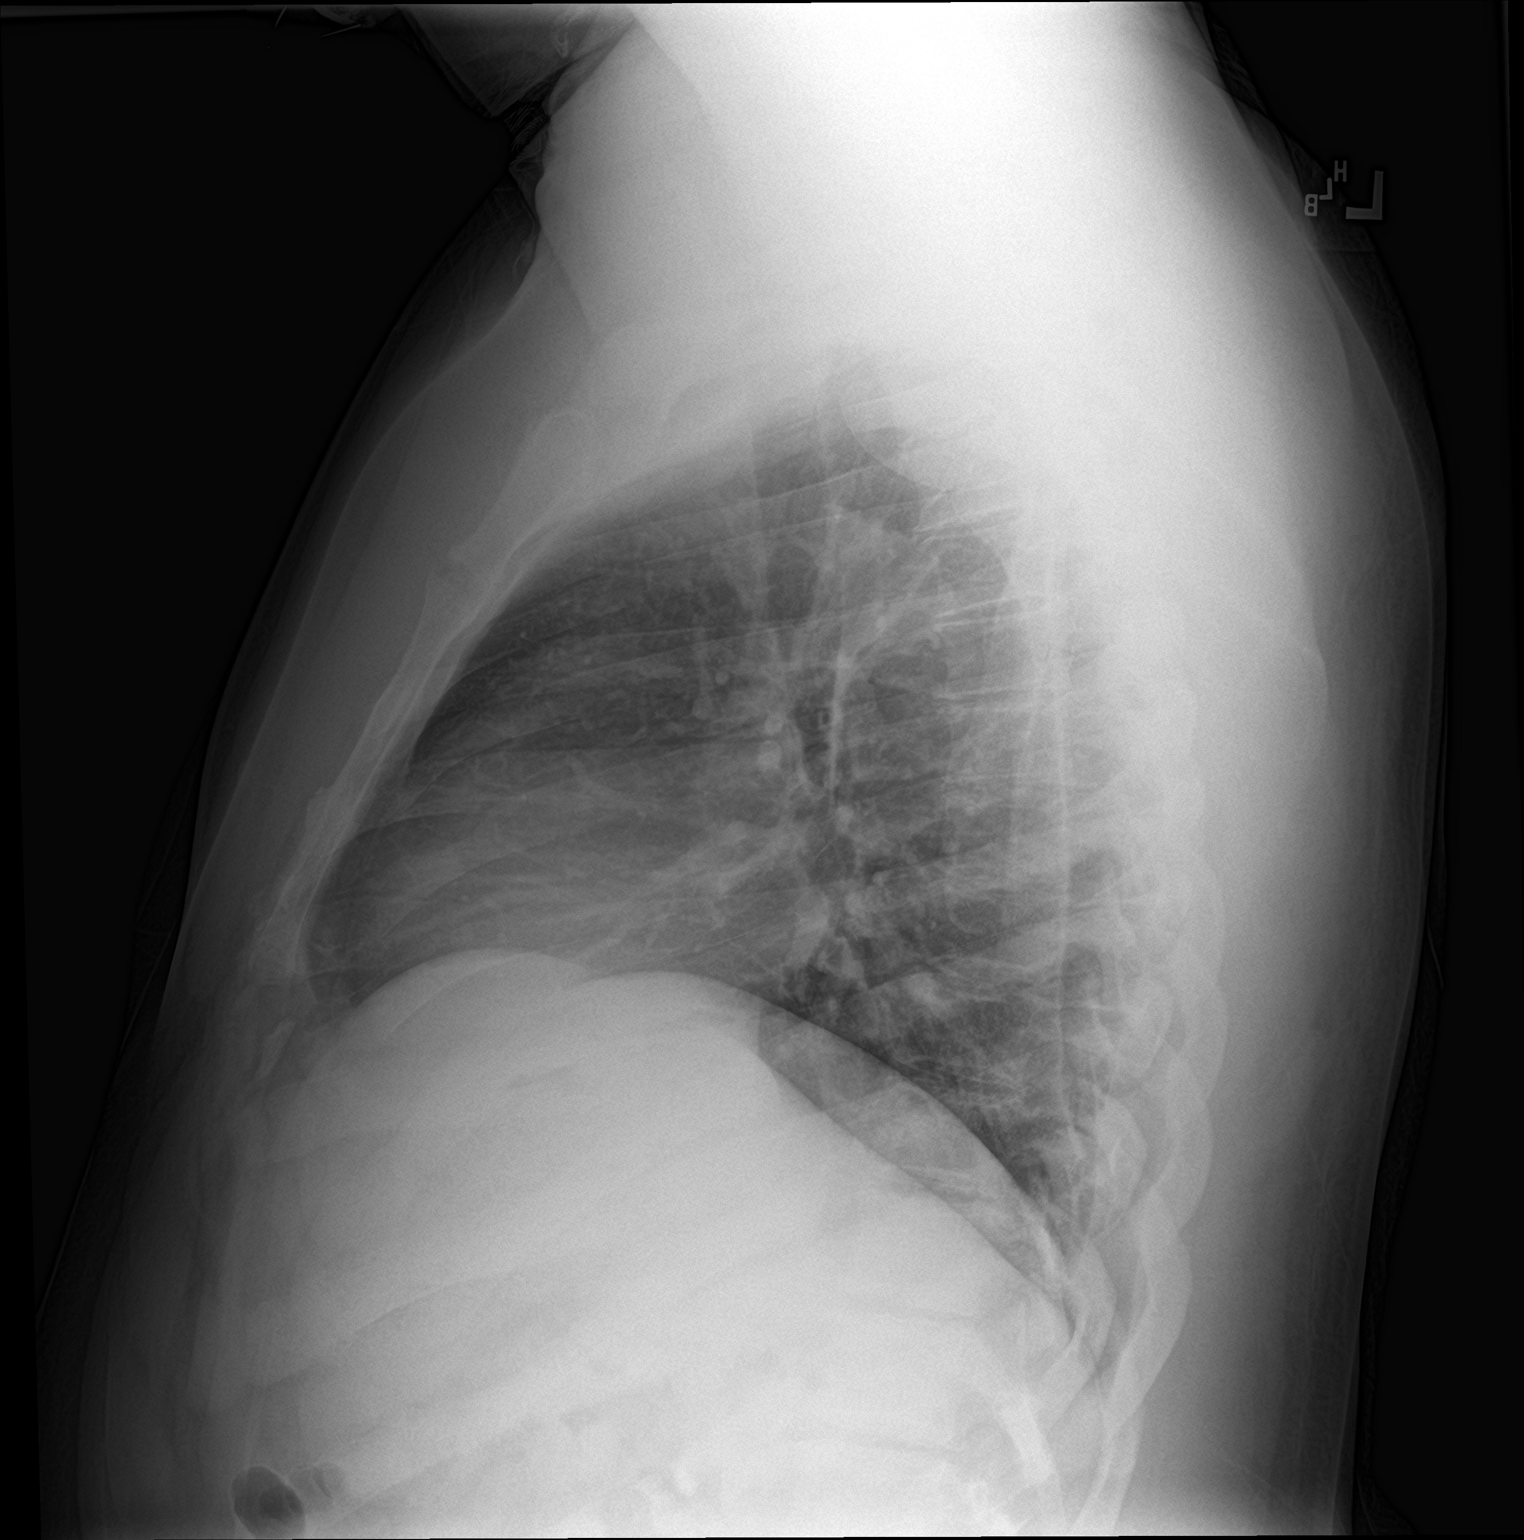

[2 of 2 positions shown; findings below may reference images not displayed]

FINDINGS: The heart size and mediastinal contours are within normal limits.
Both lungs are clear. The visualized skeletal structures are
unremarkable.
IMPRESSION: No active cardiopulmonary disease.

## 2021-09-27 ENCOUNTER — Emergency Department: Payer: No Typology Code available for payment source

## 2021-09-27 ENCOUNTER — Encounter: Payer: Self-pay | Admitting: Emergency Medicine

## 2021-09-27 ENCOUNTER — Other Ambulatory Visit: Payer: Self-pay

## 2021-09-27 ENCOUNTER — Emergency Department
Admission: EM | Admit: 2021-09-27 | Discharge: 2021-09-27 | Disposition: A | Discharge status: Home / Self Care | Payer: No Typology Code available for payment source | Attending: Emergency Medicine | Admitting: Emergency Medicine

## 2021-09-27 DIAGNOSIS — R059 Cough, unspecified: Secondary | ICD-10-CM | POA: Insufficient documentation

## 2021-09-27 DIAGNOSIS — Z20822 Contact with and (suspected) exposure to covid-19: Secondary | ICD-10-CM | POA: Insufficient documentation

## 2021-09-27 DIAGNOSIS — R0789 Other chest pain: Secondary | ICD-10-CM | POA: Diagnosis present

## 2021-09-27 LAB — BASIC METABOLIC PANEL
Anion gap: 8 (ref 5–15)
BUN: 16 mg/dL (ref 6–20)
CO2: 24 mmol/L (ref 22–32)
Calcium: 9.4 mg/dL (ref 8.9–10.3)
Chloride: 106 mmol/L (ref 98–111)
Creatinine, Ser: 1.16 mg/dL (ref 0.61–1.24)
GFR, Estimated: >60 mL/min (ref >60)
Glucose, Bld: 97 mg/dL (ref 70–99)
Potassium: 4.3 mmol/L (ref 3.5–5.1)
Sodium: 138 mmol/L (ref 135–145)

## 2021-09-27 LAB — CBC
HCT: 42.8 % (ref 39.0–52.0)
Hemoglobin: 14.5 g/dL (ref 13.0–17.0)
MCH: 30.5 pg (ref 26.0–34.0)
MCHC: 33.9 g/dL (ref 30.0–36.0)
MCV: 90.1 fL (ref 80.0–100.0)
Platelets: 216 10*3/uL (ref 150–400)
RBC: 4.75 MIL/uL (ref 4.22–5.81)
RDW: 11.9 % (ref 11.5–15.5)
WBC: 4.2 10*3/uL (ref 4.0–10.5)
nRBC: 0 % (ref 0.0–0.2)

## 2021-09-27 LAB — RESP PANEL BY RT-PCR (FLU A&B, COVID) ARPGX2
Influenza A by PCR: NEGATIVE
Influenza B by PCR: NEGATIVE
SARS Coronavirus 2 by RT PCR: NEGATIVE

## 2021-09-27 LAB — BRAIN NATRIURETIC PEPTIDE: B Natriuretic Peptide: 7.9 pg/mL (ref 0.0–100.0)

## 2021-09-27 LAB — TROPONIN I (HIGH SENSITIVITY)
Troponin I (High Sensitivity): 6 ng/L (ref <18)
Troponin I (High Sensitivity): 6 ng/L (ref <18)

## 2021-09-27 LAB — D-DIMER, QUANTITATIVE: D-Dimer, Quant: <0.27 ug/mL-FEU (ref 0.00–0.50)

## 2021-09-27 MED ORDER — BENZONATATE 100 MG PO CAPS: 100.0000 mg | ORAL_CAPSULE | Freq: Two times a day (BID) | ORAL | 0 refills | Status: DC | PRN

## 2021-09-27 NOTE — ED Notes (Signed)
See triage note  Presents with cough and some chest discomfort  Pain is increased with cough  Afebrile on arrival

## 2021-09-27 NOTE — Discharge Instructions (Signed)
Your blood work was normal.  Your COVID/flu test was negative.  Your chest x-ray was normal.  You may continue to take Tylenol/ibuprofen as needed for your symptoms.  Please return for any new, worsening, or change in symptoms or other concerns.  It was a pleasure caring for you today.

## 2021-09-27 NOTE — ED Triage Notes (Signed)
Pt. To ED from home for CP starting last night with strong, nonproductive cough. Denies SOB. Pt. States pain is worse when coughing.

## 2021-09-27 NOTE — ED Provider Notes (Signed)
[facility] Provider Note    Event Date/Time   First MD Initiated Contact with Patient 09/27/21 [phone]     (approximate)   History   Chest Pain (Pt. To ED from home for CP starting last night with strong, nonproductive cough. Denies SOB. Pt. States pain is worse when coughing.)   HPI  Bobby David is a 36 y.o. male who presents today for evaluation of chest pain.  Patient reports that he began to have pain a couple of days ago.  He reports that it is worse with coughing.  He reports that he has developed a dry cough.  He denies hemoptysis.  He he denies fevers or chills.  No known sick contacts.  He has not had any calf pain or leg swelling.  No family or personal history of PE/DVT or cardiac disease.  He reports that he does not smoke but sometimes uses cigars.  There are no problems to display for this patient.         Physical Exam   Triage Vital Signs: ED Triage Vitals  Enc Vitals Group     BP 09/27/21 0923 (!) 153/108     Pulse Rate 09/27/21 0923 79     Resp 09/27/21 0923 18     Temp 09/27/21 0923 98.3 F (36.8 C)     Temp Source 09/27/21 0923 Oral     SpO2 09/27/21 0923 95 %     Weight --      Height 09/27/21 0924 6' (1.829 m)     Head Circumference --      Peak Flow --      Pain Score 09/27/21 0924 0     Pain Loc --      Pain Edu? --      Excl. in GC? --     Most recent vital signs: Vitals:   09/27/21 1353 09/27/21 1457  BP:  (!) 140/80  Pulse:  78  Resp:  18  Temp: 98.9 F (37.2 C)   SpO2:  96%    Physical Exam Vitals and nursing note reviewed.  Constitutional:      General: Awake and alert. No acute distress.    Appearance: Normal appearance. The patient is normal weight.  HENT:     Head: Normocephalic and atraumatic.     Mouth: Mucous membranes are moist.  Eyes:     General: PERRL. Normal EOMs        Right eye: No discharge.        Left eye: No discharge.     Conjunctiva/sclera: Conjunctivae normal.   Cardiovascular:     Rate and Rhythm: Normal rate and regular rhythm.     Pulses: Normal pulses.  Equal in all 4 extremities    Heart sounds: Normal heart sounds.  Mild anterior chest wall tenderness Pulmonary:     Effort: Pulmonary effort is normal. No respiratory distress.     Breath sounds: Normal breath sounds.  Abdominal:     Abdomen is soft. There is no abdominal tenderness. No rebound or guarding. No distention. Musculoskeletal:        General: No swelling.  No pitting edema.  No tenderness to palpation.  Normal range of motion.     Cervical back: Normal range of motion and neck supple.  Skin:    General: Skin is warm and dry.     Capillary Refill: Capillary refill takes less than 2 seconds.     Findings: No rash.  Neurological:  Mental Status: The patient is awake and alert.      ED Results / Procedures / Treatments   Labs (all labs ordered are listed, but only abnormal results are displayed) Labs Reviewed  RESP PANEL BY RT-PCR (FLU A&B, COVID) ARPGX2  BASIC METABOLIC PANEL  CBC  BRAIN NATRIURETIC PEPTIDE  D-DIMER, QUANTITATIVE  TROPONIN I (HIGH SENSITIVITY)  TROPONIN I (HIGH SENSITIVITY)     EKG     RADIOLOGY I independently reviewed and interpreted imaging and agree with radiologists findings.     PROCEDURES:  Critical Care performed:   Procedures   MEDICATIONS ORDERED IN ED: Medications - No data to display   IMPRESSION / MDM / ASSESSMENT AND PLAN / ED COURSE  I reviewed the triage vital signs and the nursing notes.   Differential diagnosis includes, but is not limited to, bronchitis, costochondritis, acute coronary syndrome, myocarditis, pneumonia, COVID-19, influenza, pulmonary embolism.  Patient presents emergency department awake and alert, hemodynamically stable and afebrile.  He has a normal oxygen saturation on room air.  Chest x-ray demonstrates no acute pulmonary abnormality.  Labs obtained demonstrate normal troponin x2, negative  D-dimer.  There are no clinical signs or symptoms of DVT, and with negative D-dimer, no tachycardia or hypoxia, low suspicion for PE.  No history of PE in himself or in family members.  His COVID/flu/RSV test was negative as well.  BNP also negative.  His pain is reproducible with palpation, and given that he has been coughing a lot, he has potentially developed costochondritis.  However, we discussed very strict return precautions and importance of close outpatient follow-up.  Patient requested cough medicine which was prescribed for him.  Understands and agrees with plan.  He was discharged in the care of his family member.   Patient's presentation is most consistent with acute complicated illness / injury requiring diagnostic workup.   Clinical Course as of 09/27/21 1504  Thu Sep 27, 2021  1329 Patient does not wish to wait for his second troponin. Discussed the risks of leaving without this result and then he agreed to stay [JP]  1348 Lab reports that they still cannot find his troponin [JP]    Clinical Course User Index [JP] Mirka Barbone, Herb Grays, PA-C     FINAL CLINICAL IMPRESSION(S) / ED DIAGNOSES   Final diagnoses:  Atypical chest pain  Cough, unspecified type     Rx / DC Orders   ED Discharge Orders          Ordered    benzonatate (TESSALON PERLES) 100 MG capsule  2 times daily PRN        09/27/21 1441             Note:  This document was prepared using Dragon voice recognition software and may include unintentional dictation errors.   Keturah Shavers 09/27/21 1504    Georga Hacking, MD 09/27/21 1550

## 2022-03-02 ENCOUNTER — Ambulatory Visit
Admission: EM | Admit: 2022-03-02 | Discharge: 2022-03-02 | Disposition: A | Discharge status: Home / Self Care | Payer: 59 | Attending: Urgent Care | Admitting: Urgent Care

## 2022-03-02 ENCOUNTER — Ambulatory Visit (INDEPENDENT_AMBULATORY_CARE_PROVIDER_SITE_OTHER): Payer: 59

## 2022-03-02 DIAGNOSIS — S9781XA Crushing injury of right foot, initial encounter: Secondary | ICD-10-CM

## 2022-03-02 DIAGNOSIS — M79671 Pain in right foot: Secondary | ICD-10-CM

## 2022-03-02 DIAGNOSIS — M25571 Pain in right ankle and joints of right foot: Secondary | ICD-10-CM | POA: Diagnosis not present

## 2022-03-02 MED ORDER — NAPROXEN 500 MG PO TABS: 500.0000 mg | ORAL_TABLET | Freq: Two times a day (BID) | ORAL | 0 refills | Status: DC
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
## 2023-02-14 ENCOUNTER — Ambulatory Visit
Admission: EM | Admit: 2023-02-14 | Discharge: 2023-02-14 | Disposition: A | Discharge status: Home / Self Care | Payer: 59 | Attending: Family Medicine | Admitting: Family Medicine

## 2023-02-14 DIAGNOSIS — R590 Localized enlarged lymph nodes: Secondary | ICD-10-CM

## 2023-02-14 MED ORDER — IBUPROFEN 800 MG PO TABS: 800.0000 mg | ORAL_TABLET | Freq: Three times a day (TID) | ORAL | 0 refills | Status: DC

## 2023-02-14 MED ORDER — AMOXICILLIN-POT CLAVULANATE 875-125 MG PO TABS: 1.0000 | ORAL_TABLET | Freq: Two times a day (BID) | ORAL | 0 refills | Status: DC

## 2023-02-14 NOTE — Discharge Instructions (Addendum)
 I am addressing an infection using an antibiotic called amoxicillin -clauvalante. Infections like throat infections, middle ear infections can cause lymph node pain, swelling. If you do not have improvement in the next 24-48 hours, then go to the emergency room where you can have more testing done.

## 2023-02-14 NOTE — ED Triage Notes (Signed)
 Pt presents with c/o rt ear pain. Pt states last night he noticed swelling under and on the back of the rt ear. Has shooting pain that goes to the ear, c/o jaw pain.  Home interventions: tylenol last night.

## 2023-02-14 NOTE — ED Provider Notes (Signed)
 [name]  Note:  This document was prepared using Conservation officer, historic buildings and may include unintentional dictation errors.  MRN: [id number] DOB: August 02, 1985  Subjective:   Arnaldo Heffron is a 38 y.o. male presenting for 2-day history of acute onset persistent and worsening right-sided throat pain, neck pain, swelling along the jaw extending toward the neck.  No fever, painful swallowing, runny or stuffy nose, ear drainage, dizziness, tinnitus.  No current facility-administered medications for this encounter.  Current Outpatient Medications:    benzonatate  (TESSALON  PERLES) 100 MG capsule, Take 1 capsule (100 mg total) by mouth 2 (two) times daily as needed for cough., Disp: 10 capsule, Rfl: 0   fluticasone  (FLONASE ) 50 MCG/ACT nasal spray, Place 1 spray into both nostrils daily., Disp: 16 g, Rfl: 0   Guaifenesin  1200 MG TB12, Take 1 tablet (1,200 mg total) by mouth 2 (two) times daily., Disp: 20 tablet, Rfl: 0   naproxen  (NAPROSYN ) 500 MG tablet, Take 1 tablet (500 mg total) by mouth 2 (two) times daily with a meal., Disp: 30 tablet, Rfl: 0   promethazine -dextromethorphan (PROMETHAZINE -DM) 6.25-15 MG/5ML syrup, Take 5 mLs by mouth 4 (four) times daily as needed for cough., Disp: 240 mL, Rfl: 0   No Known Allergies  History reviewed. No pertinent past medical history.   History reviewed. No pertinent surgical history.  History reviewed. No pertinent family history.  Social History   Tobacco Use   Smoking status: Some Days    Types: Cigars   Smokeless tobacco: Never  Vaping Use   Vaping status: Never Used  Substance Use Topics   Alcohol use: Yes    Comment: occ   Drug use: Never    ROS   Objective:   Vitals: BP (!) 151/90 (BP Location: Right Arm)   Pulse 99   Temp 99.3 F (37.4 C) (Oral)   Resp 17   SpO2 93%   Physical Exam Constitutional:      General: He is not in acute distress.    Appearance: Normal appearance. He is  well-developed and normal weight. He is not ill-appearing, toxic-appearing or diaphoretic.  HENT:     Head: Normocephalic and atraumatic.     Right Ear: Tympanic membrane, ear canal and external ear normal. There is no impacted cerumen.     Left Ear: Tympanic membrane, ear canal and external ear normal. There is no impacted cerumen.     Nose: Nose normal.     Mouth/Throat:     Pharynx: Oropharynx is clear. No pharyngeal swelling, oropharyngeal exudate, posterior oropharyngeal erythema or uvula swelling.     Tonsils: No tonsillar exudate or tonsillar abscesses. 0 on the right. 0 on the left.  Eyes:     General: No scleral icterus.       Right eye: No discharge.        Left eye: No discharge.     Extraocular Movements: Extraocular movements intact.  Neck:   Cardiovascular:     Rate and Rhythm: Normal rate.  Pulmonary:     Effort: Pulmonary effort is normal.  Musculoskeletal:     Cervical back: Normal range of motion.  Neurological:     Mental Status: He is alert and oriented to person, place, and time.  Psychiatric:        Mood and Affect: Mood normal.        Behavior: Behavior normal.        Thought Content: Thought content normal.  Judgment: Judgment normal.     Assessment and Plan :   PDMP not reviewed this encounter.  1. Cervical lymphadenopathy    Recommended covering for an underlying bacterial infectious process with Augmentin .  Use ibuprofen  for pain and inflammation.  Maintain strict ER precautions.  Counseled patient on potential for adverse effects with medications prescribed/recommended today, ER and return-to-clinic precautions discussed, patient verbalized understanding.    Christopher Savannah, [place] 02/14/23 [id number]

## 2023-05-15 ENCOUNTER — Ambulatory Visit
Admission: EM | Admit: 2023-05-15 | Discharge: 2023-05-15 | Disposition: A | Discharge status: Home / Self Care | Attending: Family Medicine | Admitting: Family Medicine

## 2023-05-15 DIAGNOSIS — M722 Plantar fascial fibromatosis: Secondary | ICD-10-CM

## 2023-05-15 DIAGNOSIS — J309 Allergic rhinitis, unspecified: Secondary | ICD-10-CM | POA: Diagnosis not present

## 2023-05-15 MED ORDER — CETIRIZINE HCL 10 MG PO TABS: 10.0000 mg | ORAL_TABLET | Freq: Every day | ORAL | 0 refills | Status: AC

## 2023-05-15 MED ORDER — PREDNISONE 20 MG PO TABS: ORAL_TABLET | ORAL | 0 refills | Status: AC

## 2023-05-15 NOTE — ED Triage Notes (Addendum)
 Pt states he woke yesterday am with pain to bottom of right foot-swelling today-denies injury-took ibuprofen yesterday with some relief-c/o nasal congestion started 4 days ago-NAD-limping gait

## 2023-05-15 NOTE — ED Provider Notes (Signed)
 [name]  Note:  This document was prepared using Conservation officer, historic buildings and may include unintentional dictation errors.  MRN: [id number] DOB: 01-28-1986  Subjective:   Bobby David is a 38 y.o. male presenting for 2 primary concerns.  Reports 1 day history of persistent right foot pain, swelling.  Symptoms started while he was at work.  They persisted into today.  No fall, trauma, wounds, redness, fever.  No history of gout.  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 Reports 4-day history of persistent sinus congestion and drainage.  Symptoms started after he had a lot of work outdoors and had exposure to significant pollen.  No fever, sinus pain, throat pain, cough, chest pain, shortness of breath or wheezing.  Patient is a smoker as well.  No chronic medications.    No Known Allergies  History reviewed. No pertinent past medical history.   History reviewed. No pertinent surgical history.  No family history on file.  Social History   Tobacco Use   Smoking status: Some Days    Types: Cigars   Smokeless tobacco: Never  Vaping Use   Vaping status: Never Used  Substance Use Topics   Alcohol use: Yes    Comment: occ   Drug use: Never    ROS   Objective:   Vitals: BP (!) 152/98 (BP Location: Right Arm)   Pulse 65   Temp 98.1 F (36.7 C) (Oral)   Resp 20   SpO2 94%   Physical Exam Constitutional:      General: He is not in acute distress.    Appearance: Normal appearance. He is normal weight. He is not ill-appearing, toxic-appearing or diaphoretic.  HENT:     Head: Normocephalic and atraumatic.     Right Ear: Tympanic membrane, ear canal and external ear normal. No drainage, swelling or tenderness. No middle ear effusion. There is no impacted cerumen. Tympanic membrane is not erythematous or bulging.     Left Ear: Tympanic membrane, ear canal and external ear normal. No drainage,  swelling or tenderness.  No middle ear effusion. There is no impacted cerumen. Tympanic membrane is not erythematous or bulging.     Nose: Nose normal. No congestion or rhinorrhea.     Mouth/Throat:     Mouth: Mucous membranes are moist.     Pharynx: No oropharyngeal exudate or posterior oropharyngeal erythema.  Eyes:     General: No scleral icterus.       Right eye: No discharge.        Left eye: No discharge.     Extraocular Movements: Extraocular movements intact.     Conjunctiva/sclera: Conjunctivae normal.  Cardiovascular:     Rate and Rhythm: Normal rate.  Pulmonary:     Effort: Pulmonary effort is normal.  Musculoskeletal:     Cervical back: Normal range of motion and neck supple. No rigidity. No muscular tenderness.       Feet:     Comments: Area outlined in notes areas of tenderness with trace swelling.  No warmth, erythema, open wounds, bony deformity.  Neurological:     General: No focal deficit present.     Mental Status: He is alert and oriented to person, place, and time.  Psychiatric:        Mood and Affect: Mood normal.        Behavior: Behavior normal.     Assessment and Plan :  PDMP not reviewed this encounter.  1. Plantar fasciitis of right foot   2. Allergic rhinitis, unspecified seasonality, unspecified trigger    Recommended oral prednisone course for his significant right foot pain likely due to plantar fasciitis from the nature of his work, overuse and resulting inflammation.  I suspect this will help him with his allergic rhinitis as well.  Referral to podiatry placed.  Start Zyrtec daily for allergic rhinitis.  Counseled patient on potential for adverse effects with medications prescribed/recommended today, ER and return-to-clinic precautions discussed, patient verbalized understanding.    Wallis Bamberg, [place] 05/15/23 [id number]

## 2023-05-20 ENCOUNTER — Ambulatory Visit: Admitting: Podiatry

## 2023-05-26 ENCOUNTER — Ambulatory Visit (INDEPENDENT_AMBULATORY_CARE_PROVIDER_SITE_OTHER): Admitting: Podiatry

## 2023-05-26 DIAGNOSIS — Z91199 Patient's noncompliance with other medical treatment and regimen due to unspecified reason: Secondary | ICD-10-CM

## 2023-05-26 NOTE — Progress Notes (Signed)
 Patient was no-show for appointment today
